# Patient Record
Sex: Male | Born: 1980 | Race: White | Hispanic: No | Marital: Single | Smoking: Current every day smoker
Health system: Southern US, Community
[De-identification: ages and names within clinical notes are randomized; demographics above are authoritative.]

## PROBLEM LIST (undated history)

## (undated) HISTORY — PX: OTHER SURGICAL HISTORY: SHX169

---

## 2001-07-28 ENCOUNTER — Emergency Department (HOSPITAL_COMMUNITY): Admission: EM | Admit: 2001-07-28 | Discharge: 2001-07-28 | Payer: Self-pay | Admitting: Emergency Medicine

## 2001-07-28 ENCOUNTER — Encounter: Payer: Self-pay | Admitting: Emergency Medicine

## 2007-11-15 ENCOUNTER — Emergency Department (HOSPITAL_COMMUNITY): Admission: EM | Admit: 2007-11-15 | Discharge: 2007-11-15 | Payer: Self-pay | Admitting: Emergency Medicine

## 2009-05-13 ENCOUNTER — Ambulatory Visit (HOSPITAL_COMMUNITY): Admission: RE | Admit: 2009-05-13 | Discharge: 2009-05-13 | Payer: Self-pay | Admitting: Family Medicine

## 2010-09-01 ENCOUNTER — Emergency Department (HOSPITAL_COMMUNITY): Payer: No Typology Code available for payment source

## 2010-09-01 ENCOUNTER — Emergency Department (HOSPITAL_COMMUNITY)
Admission: EM | Admit: 2010-09-01 | Discharge: 2010-09-01 | Disposition: A | Discharge status: Home / Self Care | Payer: No Typology Code available for payment source | Attending: Emergency Medicine | Admitting: Emergency Medicine

## 2010-09-01 DIAGNOSIS — M542 Cervicalgia: Secondary | ICD-10-CM | POA: Insufficient documentation

## 2010-09-01 DIAGNOSIS — S139XXA Sprain of joints and ligaments of unspecified parts of neck, initial encounter: Secondary | ICD-10-CM | POA: Insufficient documentation

## 2010-09-01 DIAGNOSIS — M25519 Pain in unspecified shoulder: Secondary | ICD-10-CM | POA: Insufficient documentation

## 2011-03-01 ENCOUNTER — Emergency Department (HOSPITAL_COMMUNITY)
Admission: EM | Admit: 2011-03-01 | Discharge: 2011-03-01 | Disposition: A | Discharge status: Home / Self Care | Payer: Self-pay | Attending: Emergency Medicine | Admitting: Emergency Medicine

## 2011-03-01 ENCOUNTER — Encounter: Payer: Self-pay | Admitting: *Deleted

## 2011-03-01 DIAGNOSIS — L0231 Cutaneous abscess of buttock: Secondary | ICD-10-CM | POA: Insufficient documentation

## 2011-03-01 DIAGNOSIS — L03317 Cellulitis of buttock: Secondary | ICD-10-CM

## 2011-03-01 MED ORDER — DOXYCYCLINE HYCLATE 100 MG PO TABS
100.0000 mg | ORAL_TABLET | Freq: Once | ORAL | Status: AC
  Administered 2011-03-01: 100 mg via ORAL
  Filled 2011-03-01: qty 1

## 2011-03-01 MED ORDER — HYDROCODONE-ACETAMINOPHEN 5-325 MG PO TABS: ORAL_TABLET | ORAL | Status: AC

## 2011-03-01 MED ORDER — DOXYCYCLINE HYCLATE 100 MG PO CAPS: 100.0000 mg | ORAL_CAPSULE | Freq: Two times a day (BID) | ORAL | Status: AC

## 2011-03-01 MED ORDER — IBUPROFEN 800 MG PO TABS
800.0000 mg | ORAL_TABLET | Freq: Once | ORAL | Status: AC
  Administered 2011-03-01: 800 mg via ORAL
  Filled 2011-03-01: qty 1

## 2011-03-01 MED ORDER — HYDROCODONE-ACETAMINOPHEN 5-325 MG PO TABS
1.0000 | ORAL_TABLET | Freq: Once | ORAL | Status: AC
  Administered 2011-03-01: 1 via ORAL
  Filled 2011-03-01: qty 1

## 2011-03-01 NOTE — ED Provider Notes (Signed)
Medical screening examination/treatment/procedure(s) were performed by non-physician practitioner and as supervising physician I was immediately available for consultation/collaboration.   Juliet Rude. Rubin Payor, MD 03/01/11 2018

## 2011-03-01 NOTE — ED Notes (Signed)
Noted abscess to right inner buttock
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
## 2011-03-01 NOTE — ED Notes (Signed)
Abscess to groin area ?

## 2012-05-22 ENCOUNTER — Emergency Department (HOSPITAL_COMMUNITY)
Admission: EM | Admit: 2012-05-22 | Discharge: 2012-05-22 | Disposition: A | Discharge status: Home / Self Care | Payer: Self-pay | Attending: Emergency Medicine | Admitting: Emergency Medicine

## 2012-05-22 ENCOUNTER — Encounter (HOSPITAL_COMMUNITY): Payer: Self-pay | Admitting: *Deleted

## 2012-05-22 DIAGNOSIS — F17200 Nicotine dependence, unspecified, uncomplicated: Secondary | ICD-10-CM | POA: Insufficient documentation

## 2012-05-22 DIAGNOSIS — Z202 Contact with and (suspected) exposure to infections with a predominantly sexual mode of transmission: Secondary | ICD-10-CM | POA: Insufficient documentation

## 2012-05-22 LAB — URINE MICROSCOPIC-ADD ON

## 2012-05-22 LAB — URINALYSIS, ROUTINE W REFLEX MICROSCOPIC
Bilirubin Urine: NEGATIVE
Hgb urine dipstick: NEGATIVE
Specific Gravity, Urine: 1.02 (ref 1.005–1.030)
pH: 6 (ref 5.0–8.0)

## 2012-05-22 MED ORDER — AZITHROMYCIN 250 MG PO TABS
1000.0000 mg | ORAL_TABLET | Freq: Once | ORAL | Status: AC
  Administered 2012-05-22: 1000 mg via ORAL
  Filled 2012-05-22: qty 4

## 2012-05-22 MED ORDER — LIDOCAINE HCL (PF) 1 % IJ SOLN
INTRAMUSCULAR | Status: AC
  Administered 2012-05-22: 18:00:00
  Filled 2012-05-22: qty 5

## 2012-05-22 MED ORDER — CEFTRIAXONE SODIUM 250 MG IJ SOLR
250.0000 mg | Freq: Once | INTRAMUSCULAR | Status: AC
  Administered 2012-05-22: 250 mg via INTRAMUSCULAR
  Filled 2012-05-22: qty 250
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
## 2012-05-24 ENCOUNTER — Telehealth (HOSPITAL_COMMUNITY): Payer: Self-pay | Admitting: Emergency Medicine

## 2012-05-24 LAB — URINE CULTURE

## 2012-05-24 NOTE — ED Provider Notes (Signed)
Medical screening examination/treatment/procedure(s) were performed by non-physician practitioner and as supervising physician I was immediately available for consultation/collaboration.  Jonan Seufert, MD 05/24/12 1014 

## 2012-05-24 NOTE — ED Notes (Signed)
+  Chlamydia. Patient treated with Rocephin and Zithromax. Per protocol MD. [facility] faxed. °
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
## 2012-05-25 ENCOUNTER — Telehealth (HOSPITAL_COMMUNITY): Payer: Self-pay | Admitting: Emergency Medicine

## 2012-05-28 ENCOUNTER — Telehealth (HOSPITAL_COMMUNITY): Payer: Self-pay | Admitting: Emergency Medicine

## 2012-05-28 NOTE — ED Notes (Signed)
Pt called after receiving letter.  ID verified x 2.  Pt informed of dx, tx approp., notify partner(s) for testing and tx and abstain from sex 2 weeks post tx.  Treated with Zithromax and Rocephin.

## 2013-04-08 ENCOUNTER — Other Ambulatory Visit (HOSPITAL_COMMUNITY): Payer: Self-pay | Admitting: Orthopaedic Surgery

## 2013-04-08 DIAGNOSIS — M25511 Pain in right shoulder: Secondary | ICD-10-CM

## 2013-04-24 ENCOUNTER — Ambulatory Visit (HOSPITAL_COMMUNITY)
Admission: RE | Admit: 2013-04-24 | Discharge: 2013-04-24 | Disposition: A | Discharge status: Home / Self Care | Payer: 59 | Source: Ambulatory Visit | Attending: Orthopaedic Surgery | Admitting: Orthopaedic Surgery

## 2013-04-24 ENCOUNTER — Other Ambulatory Visit (HOSPITAL_COMMUNITY): Payer: Self-pay | Admitting: Orthopaedic Surgery

## 2013-04-24 DIAGNOSIS — M719 Bursopathy, unspecified: Secondary | ICD-10-CM | POA: Insufficient documentation

## 2013-04-24 DIAGNOSIS — M25511 Pain in right shoulder: Secondary | ICD-10-CM

## 2013-04-24 DIAGNOSIS — M67919 Unspecified disorder of synovium and tendon, unspecified shoulder: Secondary | ICD-10-CM | POA: Insufficient documentation

## 2013-04-24 DIAGNOSIS — Z1389 Encounter for screening for other disorder: Secondary | ICD-10-CM | POA: Insufficient documentation

## 2013-04-24 DIAGNOSIS — M19019 Primary osteoarthritis, unspecified shoulder: Secondary | ICD-10-CM | POA: Insufficient documentation

## 2015-08-14 ENCOUNTER — Emergency Department (HOSPITAL_COMMUNITY)
Admission: EM | Admit: 2015-08-14 | Discharge: 2015-08-14 | Disposition: A | Discharge status: Home / Self Care | Payer: Self-pay | Attending: Emergency Medicine | Admitting: Emergency Medicine

## 2015-08-14 ENCOUNTER — Encounter (HOSPITAL_COMMUNITY): Payer: Self-pay | Admitting: Emergency Medicine

## 2015-08-14 DIAGNOSIS — K0381 Cracked tooth: Secondary | ICD-10-CM | POA: Insufficient documentation

## 2015-08-14 DIAGNOSIS — K029 Dental caries, unspecified: Secondary | ICD-10-CM | POA: Insufficient documentation

## 2015-08-14 DIAGNOSIS — R51 Headache: Secondary | ICD-10-CM | POA: Insufficient documentation

## 2015-08-14 DIAGNOSIS — F17210 Nicotine dependence, cigarettes, uncomplicated: Secondary | ICD-10-CM | POA: Insufficient documentation

## 2015-08-14 DIAGNOSIS — R6883 Chills (without fever): Secondary | ICD-10-CM | POA: Insufficient documentation

## 2015-08-14 DIAGNOSIS — K0889 Other specified disorders of teeth and supporting structures: Secondary | ICD-10-CM

## 2015-08-14 DIAGNOSIS — R61 Generalized hyperhidrosis: Secondary | ICD-10-CM | POA: Insufficient documentation

## 2015-08-14 MED ORDER — TRAMADOL HCL 50 MG PO TABS: 100.0000 mg | ORAL_TABLET | Freq: Four times a day (QID) | ORAL | Status: AC | PRN

## 2015-08-14 MED ORDER — PENICILLIN V POTASSIUM 500 MG PO TABS
500.0000 mg | ORAL_TABLET | Freq: Four times a day (QID) | ORAL | Status: AC
Start: 2015-08-14 — End: ?

## 2015-08-14 MED ORDER — KETOROLAC TROMETHAMINE 30 MG/ML IJ SOLN
60.0000 mg | Freq: Once | INTRAMUSCULAR | Status: AC
  Administered 2015-08-14: 60 mg via INTRAMUSCULAR
  Filled 2015-08-14: qty 2

## 2015-08-14 MED ORDER — PENICILLIN V POTASSIUM 250 MG PO TABS
500.0000 mg | ORAL_TABLET | Freq: Once | ORAL | Status: AC
  Administered 2015-08-14: 500 mg via ORAL
  Filled 2015-08-14: qty 2

## 2015-08-14 NOTE — ED Notes (Signed)
Pt states understanding of care given and follow up instructions, ambulated from ED
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
## 2015-08-14 NOTE — ED Notes (Signed)
Pt states that he broke a tooth a couple months ago that has been hurting on and off.  Now having numbness from lip over to left jaw

## 2015-08-14 NOTE — Discharge Instructions (Signed)
Use ice on your jaw for comfort. Take ibuprofen 600 mg + acetaminophen 1000 mg 4 times a day for pain. Take the tramadol for pain until gone. Take the Pen VK until gone. YOU NEED TO SEE A DENTIST!!!      Dental Caries Dental caries (also called tooth decay) is the most common oral disease. It can occur at any age but is more common in children and young adults.  HOW DENTAL CARIES DEVELOPS  The process of decay begins when bacteria and foods (particularly sugars and starches) combine in your mouth to produce plaque. Plaque is a substance that sticks to the hard, outer surface of a tooth (enamel). The bacteria in plaque produce acids that attack enamel. These acids may also attack the root surface of a tooth (cementum) if it is exposed. Repeated attacks dissolve these surfaces and create holes in the tooth (cavities). If left untreated, the acids destroy the other layers of the tooth.  RISK FACTORS  Frequent sipping of sugary beverages.   Frequent snacking on sugary and starchy foods, especially those that easily get stuck in the teeth.   Poor oral hygiene.   Dry mouth.   Substance abuse such as methamphetamine abuse.   Broken or poor-fitting dental restorations.   Eating disorders.   Gastroesophageal reflux disease (GERD).   Certain radiation treatments to the head and neck. SYMPTOMS In the early stages of dental caries, symptoms are seldom present. Sometimes white, chalky areas may be seen on the enamel or other tooth layers. In later stages, symptoms may include:  Pits and holes on the enamel.  Toothache after sweet, hot, or cold foods or drinks are consumed.  Pain around the tooth.  Swelling around the tooth. DIAGNOSIS  Most of the time, dental caries is detected during a regular dental checkup. A diagnosis is made after a thorough medical and dental history is taken and the surfaces of your teeth are checked for signs of dental caries. Sometimes special instruments,  such as lasers, are used to check for dental caries. Dental X-ray exams may be taken so that areas not visible to the eye (such as between the contact areas of the teeth) can be checked for cavities.  TREATMENT  If dental caries is in its early stages, it may be reversed with a fluoride treatment or an application of a remineralizing agent at the dental office. Thorough brushing and flossing at home is needed to aid these treatments. If it is in its later stages, treatment depends on the location and extent of tooth destruction:   If a small area of the tooth has been destroyed, the destroyed area will be removed and cavities will be filled with a material such as gold, silver amalgam, or composite resin.   If a large area of the tooth has been destroyed, the destroyed area will be removed and a cap (crown) will be fitted over the remaining tooth structure.   If the center part of the tooth (pulp) is affected, a procedure called a root canal will be needed before a filling or crown can be placed.   If most of the tooth has been destroyed, the tooth may need to be pulled (extracted). HOME CARE INSTRUCTIONS You can prevent, stop, or reverse dental caries at home by practicing good oral hygiene. Good oral hygiene includes:  Thoroughly cleaning your teeth at least twice a day with a toothbrush and dental floss.   Using a fluoride toothpaste. A fluoride mouth rinse may also be  used if recommended by your dentist or health care provider.   Restricting the amount of sugary and starchy foods and sugary liquids you consume.   Avoiding frequent snacking on these foods and sipping of these liquids.   Keeping regular visits with a dentist for checkups and cleanings. PREVENTION   Practice good oral hygiene.  Consider a dental sealant. A dental sealant is a coating material that is applied by your dentist to the pits and grooves of teeth. The sealant prevents food from being trapped in them. It  may protect the teeth for several years.  Ask about fluoride supplements if you live in a community without fluorinated water or with water that has a low fluoride content. Use fluoride supplements as directed by your dentist or health care provider.  Allow fluoride varnish applications to teeth if directed by your dentist or health care provider.   This information is not intended to replace advice given to you by your health care provider. Make sure you discuss any questions you have with your health care provider.   Document Released: 03/24/2002 Document Revised: 07/23/2014 Document Reviewed: 07/04/2012 Elsevier Interactive Patient Education 2016 [employer]  Dental Pain Dental pain may be caused by many things, including:  Tooth decay (cavities or caries). Cavities cause the nerve of your tooth to be open to air and hot or cold temperatures. This can cause pain or discomfort.  Abscess or infection. A dental abscess is an area that is full of infected pus from a bacterial infection in the inner part of the tooth (pulp). It usually happens at the end of the tooth's root.  Injury.  An unknown reason (idiopathic). Your pain may be mild or severe. It may only happen when:  You are chewing.  You are exposed to hot or cold temperature.  You are eating or drinking sugary foods or beverages, such as:  Soda.  Candy. Your pain may also be there all of the time. HOME CARE Watch your dental pain for any changes. Do these things to lessen your discomfort:  Take medicines only as told by your dentist.  If your dentist tells you to take an antibiotic medicine, finish all of it even if you start to feel better.  Keep all follow-up visits as told by your dentist. This is important.  Do not apply heat to the outside of your face.  Rinse your mouth or gargle with salt water if told by your dentist. This helps with pain and swelling.  You can make salt water by adding  tsp of salt to  1 cup of warm water.  Apply ice to the painful area of your face:  Put ice in a plastic bag.  Place a towel between your skin and the bag.  Leave the ice on for 20 minutes, 2-3 times per day.  Avoid foods or drinks that cause you pain, such as:  Very hot or very cold foods or drinks.  Sweet or sugary foods or drinks. GET HELP IF:  Your pain is not helped with medicines.  Your symptoms are worse.  You have new symptoms. GET HELP RIGHT AWAY IF:  You cannot open your mouth.  You are having trouble breathing or swallowing.  You have a fever.  Your face, neck, or jaw is puffy (swollen).   This information is not intended to replace advice given to you by your health care provider. Make sure you discuss any questions you have with your health care provider.  Document Released: 12/19/2007 Document Revised: 11/16/2014 Document Reviewed: 06/28/2014 Elsevier Interactive Patient Education [employer]

## 2017-05-02 ENCOUNTER — Encounter (HOSPITAL_COMMUNITY): Payer: Self-pay

## 2017-05-02 DIAGNOSIS — N3001 Acute cystitis with hematuria: Secondary | ICD-10-CM | POA: Insufficient documentation

## 2017-05-02 DIAGNOSIS — F17210 Nicotine dependence, cigarettes, uncomplicated: Secondary | ICD-10-CM | POA: Insufficient documentation

## 2017-05-02 DIAGNOSIS — Z79899 Other long term (current) drug therapy: Secondary | ICD-10-CM | POA: Insufficient documentation

## 2017-05-02 NOTE — ED Triage Notes (Signed)
Pt reports right flank pain for a couple of days, tonight has passed bright red blood with some burning with urination.

## 2017-05-03 ENCOUNTER — Emergency Department (HOSPITAL_COMMUNITY)
Admission: EM | Admit: 2017-05-03 | Discharge: 2017-05-03 | Disposition: A | Discharge status: Home / Self Care | Payer: Self-pay | Attending: Emergency Medicine | Admitting: Emergency Medicine

## 2017-05-03 DIAGNOSIS — N3001 Acute cystitis with hematuria: Secondary | ICD-10-CM

## 2017-05-03 LAB — URINALYSIS, ROUTINE W REFLEX MICROSCOPIC
Bilirubin Urine: NEGATIVE
GLUCOSE, UA: NEGATIVE mg/dL
Ketones, ur: 20 mg/dL — AB
NITRITE: POSITIVE — AB
PH: 5 (ref 5.0–8.0)
PROTEIN: 100 mg/dL — AB
Specific Gravity, Urine: 1.021 (ref 1.005–1.030)
Squamous Epithelial / LPF: NONE SEEN
TRANS EPITHEL UA: 1

## 2017-05-03 MED ORDER — CEPHALEXIN 500 MG PO CAPS
500.0000 mg | ORAL_CAPSULE | Freq: Once | ORAL | Status: AC
  Administered 2017-05-03: 500 mg via ORAL
  Filled 2017-05-03: qty 1

## 2017-05-03 MED ORDER — HYDROCODONE-ACETAMINOPHEN 5-325 MG PO TABS
1.0000 | ORAL_TABLET | Freq: Once | ORAL | Status: AC
  Administered 2017-05-03: 1 via ORAL
  Filled 2017-05-03: qty 1

## 2017-05-03 MED ORDER — CEPHALEXIN 500 MG PO CAPS: 500.0000 mg | ORAL_CAPSULE | Freq: Four times a day (QID) | ORAL | 0 refills | Status: AC
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
## 2017-05-05 LAB — URINE CULTURE: Culture: >=100000 — AB

## 2017-05-06 ENCOUNTER — Telehealth: Payer: Self-pay | Admitting: Emergency Medicine

## 2017-05-06 NOTE — Telephone Encounter (Signed)
Post ED Visit - Positive Culture Follow-up  Culture report reviewed by antimicrobial stewardship pharmacist:  []  Enzo BiNathan Batchelder, Pharm.D. []  Celedonio MiyamotoJeremy Frens, Pharm.D., BCPS AQ-ID []  Garvin FilaMike Maccia, Pharm.D., BCPS []  Georgina PillionElizabeth Martin, Pharm.D., BCPS []  Pine MountainMinh Pham, 17[address] Rainbow BoulevardPharm.D., BCPS, AAHIVP [x]  Estella HuskMichelle Turner, Pharm.D., BCPS, AAHIVP []  Lysle Pearlachel Rumbarger, PharmD, BCPS []  Casilda Carlsaylor Stone, PharmD, BCPS []  Pollyann SamplesAndy Johnston, PharmD, BCPS  Positive urine culture Treated with cephalexin, organism sensitive to the same and no further patient follow-up is required at this time.  Berle MullMiller, Andrick Rust 05/06/2017, 3:36 PM

## 2017-05-20 ENCOUNTER — Other Ambulatory Visit: Payer: Self-pay

## 2017-05-20 ENCOUNTER — Emergency Department (HOSPITAL_COMMUNITY)
Admission: EM | Admit: 2017-05-20 | Discharge: 2017-05-21 | Disposition: A | Discharge status: Home / Self Care | Payer: Self-pay | Attending: Emergency Medicine | Admitting: Emergency Medicine

## 2017-05-20 ENCOUNTER — Encounter (HOSPITAL_COMMUNITY): Payer: Self-pay | Admitting: Emergency Medicine

## 2017-05-20 DIAGNOSIS — Z79899 Other long term (current) drug therapy: Secondary | ICD-10-CM | POA: Insufficient documentation

## 2017-05-20 DIAGNOSIS — R319 Hematuria, unspecified: Secondary | ICD-10-CM | POA: Insufficient documentation

## 2017-05-20 DIAGNOSIS — F17210 Nicotine dependence, cigarettes, uncomplicated: Secondary | ICD-10-CM | POA: Insufficient documentation

## 2017-05-20 DIAGNOSIS — N390 Urinary tract infection, site not specified: Secondary | ICD-10-CM | POA: Insufficient documentation

## 2017-05-20 NOTE — ED Triage Notes (Signed)
Pt c/o left flank pain and hematuria.

## 2017-05-21 ENCOUNTER — Other Ambulatory Visit: Payer: Self-pay

## 2017-05-21 ENCOUNTER — Emergency Department (HOSPITAL_COMMUNITY): Payer: Self-pay

## 2017-05-21 LAB — URINALYSIS, ROUTINE W REFLEX MICROSCOPIC
BILIRUBIN URINE: NEGATIVE
Glucose, UA: NEGATIVE mg/dL
KETONES UR: NEGATIVE mg/dL
Nitrite: POSITIVE — AB
PROTEIN: 100 mg/dL — AB
Specific Gravity, Urine: 1.015 (ref 1.005–1.030)
pH: 6 (ref 5.0–8.0)

## 2017-05-21 MED ORDER — SULFAMETHOXAZOLE-TRIMETHOPRIM 800-160 MG PO TABS
1.0000 | ORAL_TABLET | Freq: Once | ORAL | Status: AC
  Administered 2017-05-21: 1 via ORAL
  Filled 2017-05-21: qty 1

## 2017-05-21 MED ORDER — KETOROLAC TROMETHAMINE 30 MG/ML IJ SOLN
60.0000 mg | Freq: Once | INTRAMUSCULAR | Status: AC
  Administered 2017-05-21: 60 mg via INTRAMUSCULAR
  Filled 2017-05-21: qty 2

## 2017-05-21 MED ORDER — SULFAMETHOXAZOLE-TRIMETHOPRIM 800-160 MG PO TABS: 1.0000 | ORAL_TABLET | Freq: Two times a day (BID) | ORAL | 0 refills | Status: AC
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
## 2017-05-21 NOTE — Discharge Instructions (Signed)
Drink plenty of fluids. You can take ibuprofen 600 mg and/or acetaminophen 1000 mg every 6 hrs for pain. Take the antibiotic until gone.  Return to the ED if you get fever or vomiting. Otherwise you need to call [facility] to have them evaluate why you are getting these urinary tract infections.

## 2017-05-23 LAB — URINE CULTURE
Culture: >=100000 — AB
SPECIAL REQUESTS: NORMAL

## 2017-05-24 ENCOUNTER — Telehealth: Payer: Self-pay

## 2017-05-24 NOTE — Telephone Encounter (Signed)
Post ED Visit - Positive Culture Follow-up  Culture report reviewed by antimicrobial stewardship pharmacist:  []  Randall Chambers, Pharm.Randall. []  Randall Chambers, Pharm.Randall., BCPS AQ-ID []  Randall Chambers, Pharm.Randall., BCPS []  Randall Chambers, Pharm.Randall., BCPS []  Randall Chambers, [place].Randall., BCPS, AAHIVP []  Randall Chambers, Pharm.Randall., BCPS, AAHIVP []  Randall Chambers, PharmD, BCPS []  Randall Chambers, PharmD, BCPS []  Randall Chambers, PharmD, BCPS Randall Chambers Pharm Randall Positive urine culture Treated with Sulfamethoxazole Trimethoprim, organism sensitive to the same and no further patient follow-up is required at this time.  Randall Chambers, Randall Chambers 05/24/2017, 9:52 AM

## 2019-05-08 IMAGING — CT CT RENAL STONE PROTOCOL
2 of 4 series · 16 of 46 positions shown, 18 images · non-contrast
Comparison: None.

CLINICAL DATA: Left flank pain and hematuria

EXAM:
CT ABDOMEN AND PELVIS WITHOUT CONTRAST
TECHNIQUE: Multidetector CT imaging of the abdomen and pelvis was performed
following the standard protocol without IV contrast.

[Series 2: axial st · axial · 0.74mm/px · z∈[+907,+1342]mm · 13 of 95 slices shown, 15 images]
[im 4/95  soft-tissue]
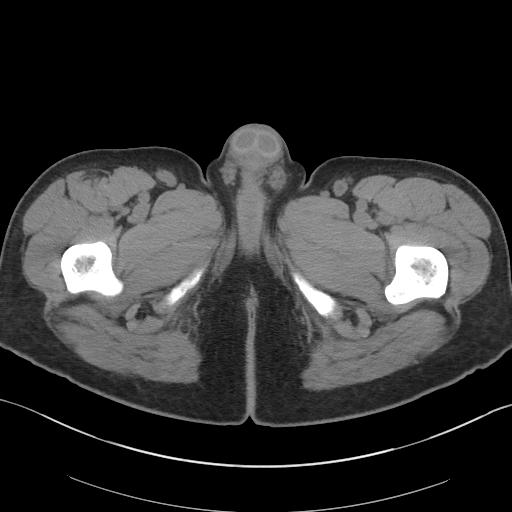
[im 4/95  bone]
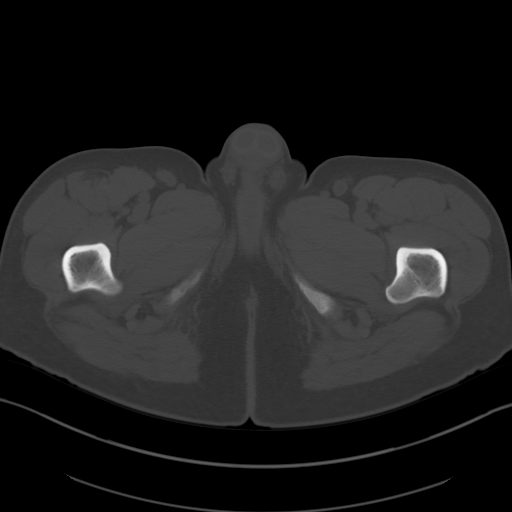
[im 12/95  soft-tissue]
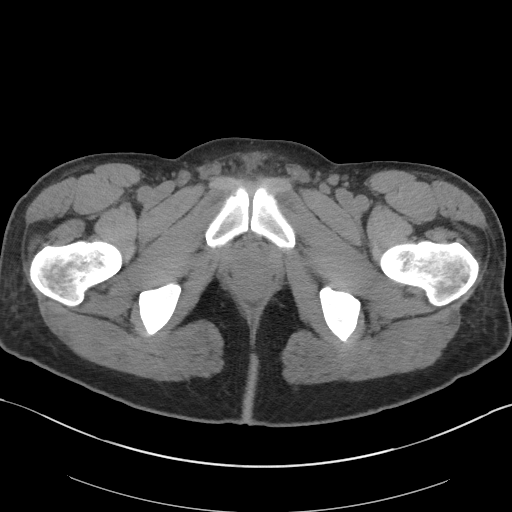
[im 20/95  soft-tissue]
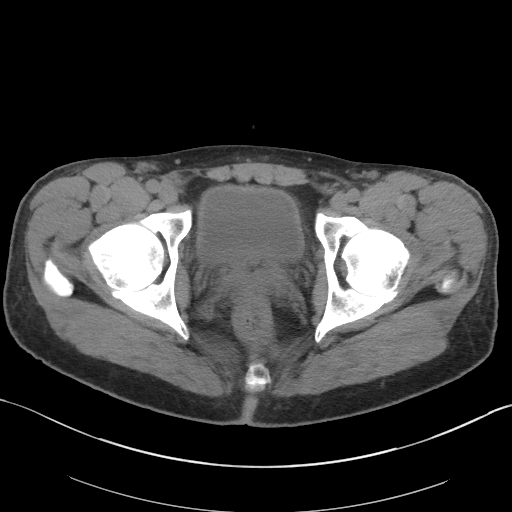
[im 28/95  soft-tissue]
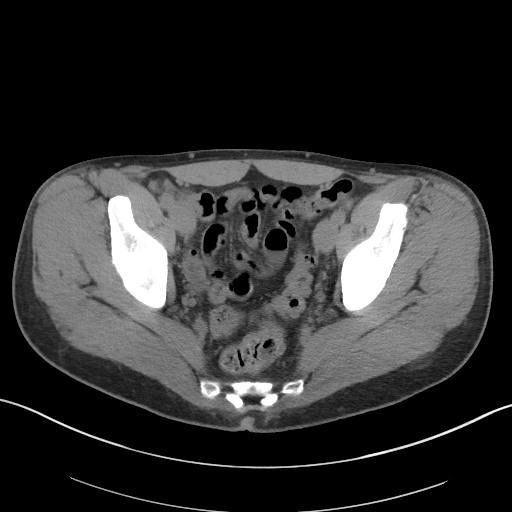
[im 32/95  soft-tissue]
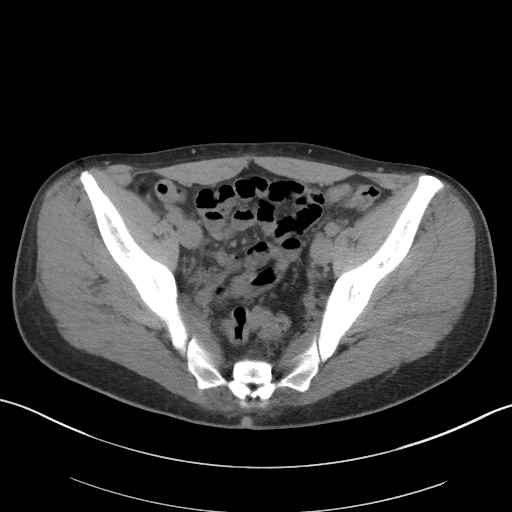
[im 40/95  soft-tissue]
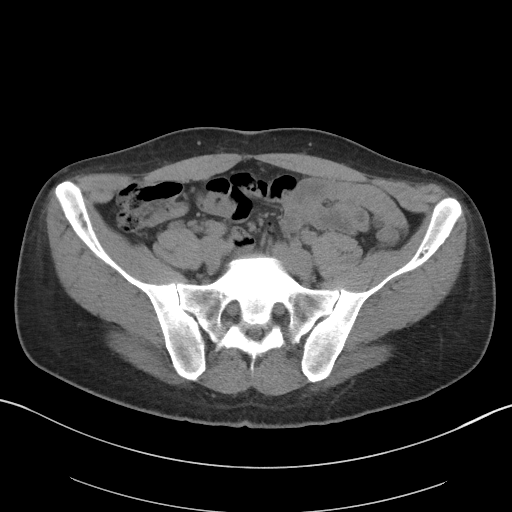
[im 48/95  soft-tissue]
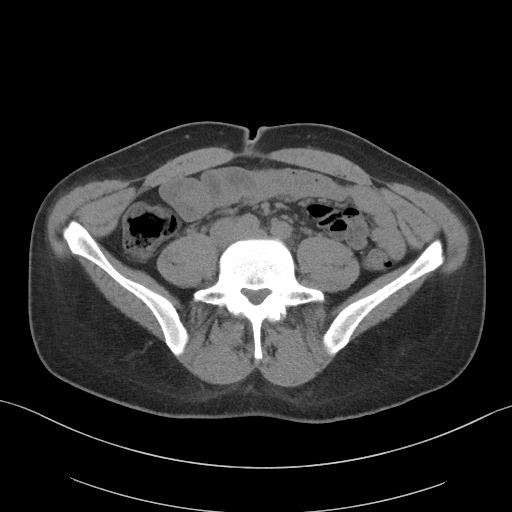
[im 55/95  soft-tissue]
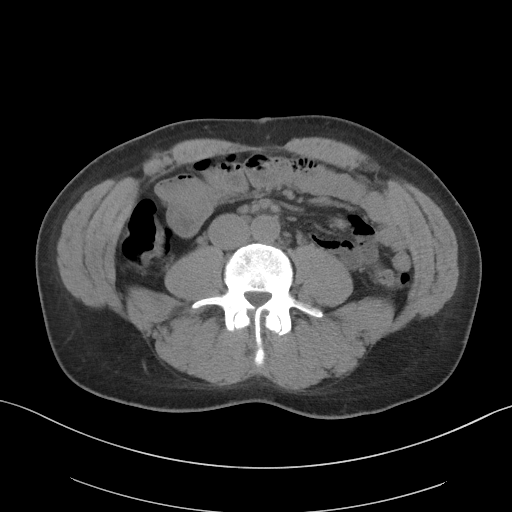
[im 63/95  soft-tissue]
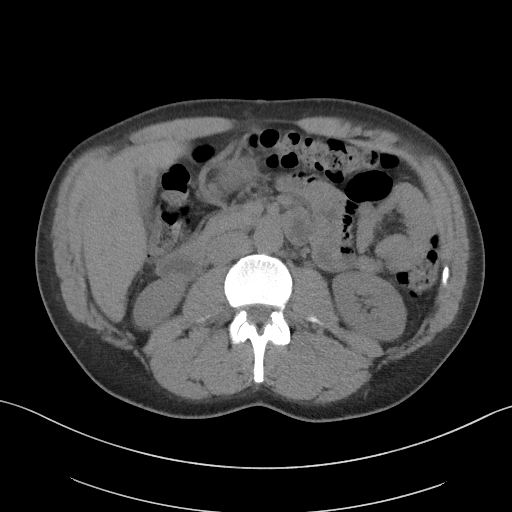
[im 63/95  bone]
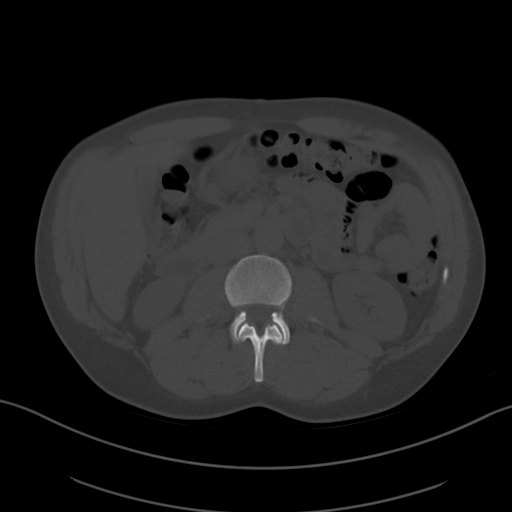
[im 67/95  soft-tissue]
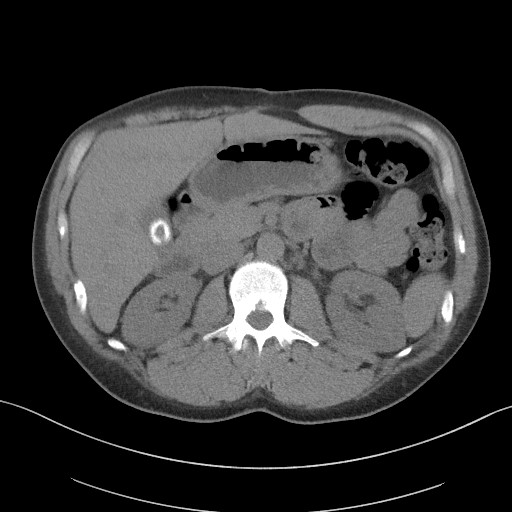
[im 75/95  soft-tissue]
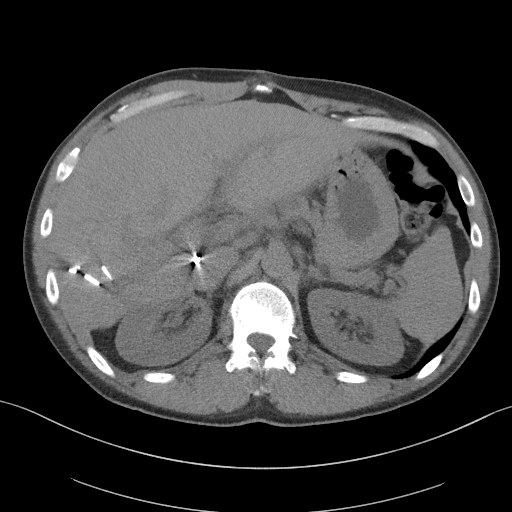
[im 83/95  soft-tissue]
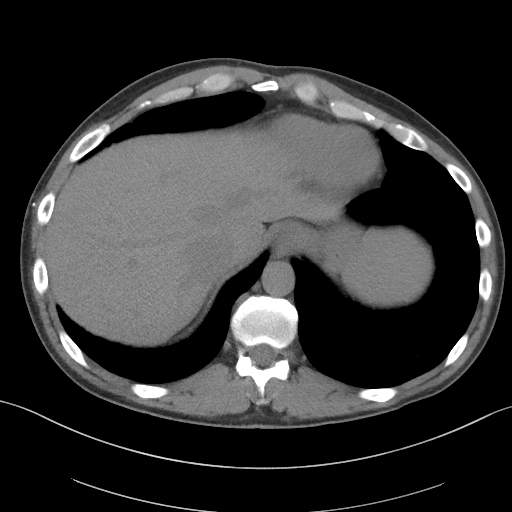
[im 91/95  soft-tissue]
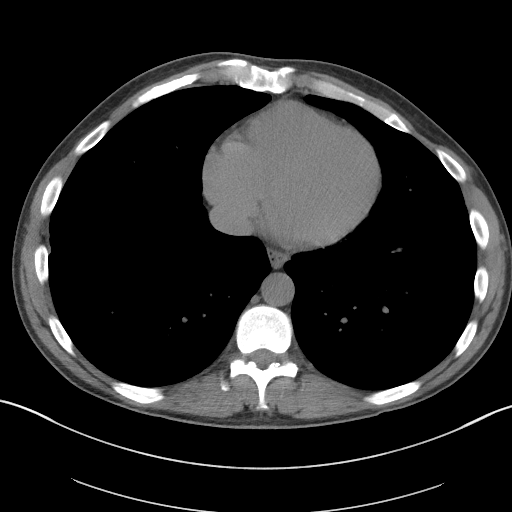

[Series 5: coronal st · coronal · 0.79mm/px · 3 of 93 slices shown]
[im 31/93  soft-tissue]
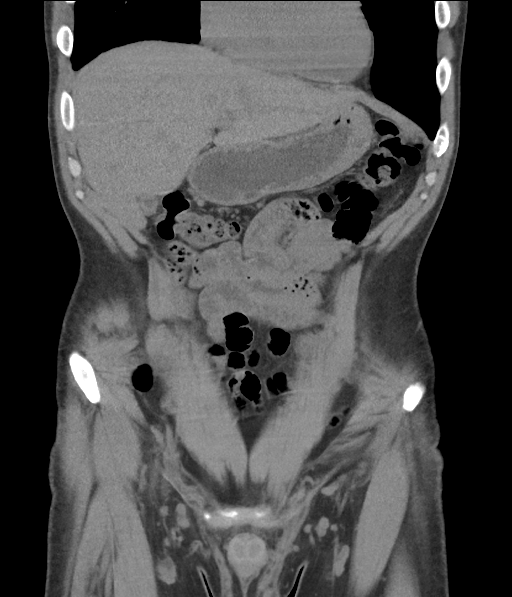
[im 41/93  soft-tissue]
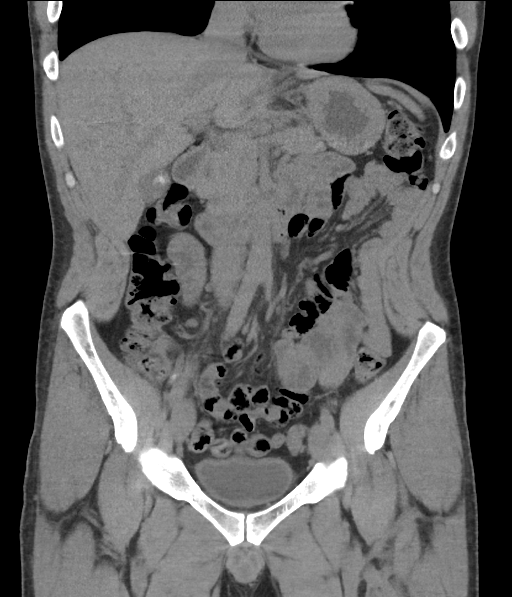
[im 52/93  soft-tissue]
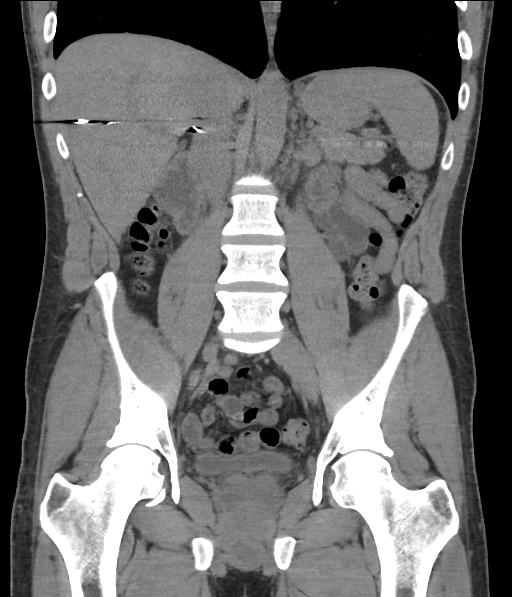

[16 of 46 positions shown; findings below may reference images not displayed]

FINDINGS: Lower chest: Focal pleural and parenchymal scarring in the right
lower lobe. No acute consolidation or pleural effusion. Normal heart
size.

Hepatobiliary: Surgical clips in the right hepatic lobe. Calcified
gallstone. No biliary dilatation

Pancreas: Unremarkable. No pancreatic ductal dilatation or
surrounding inflammatory changes.

Spleen: Normal in size without focal abnormality.

Adrenals/Urinary Tract: Metallic fragment posterior to the left
kidney.

Stomach/Bowel: Stomach is within normal limits. Appendix appears
normal. Small amount of high density within the lumen of the
appendix but no inflammation. No evidence of bowel wall thickening,
distention, or inflammatory changes.

Vascular/Lymphatic: No significant vascular findings are present. No
enlarged abdominal or pelvic lymph nodes.

Reproductive: Prostate is unremarkable.

Other: Negative for free air or free fluid.

Musculoskeletal: No acute or suspicious bone lesion. Probable
chronic superior endplate deformity at L1
IMPRESSION: 1. Negative for nephrolithiasis, hydronephrosis, or ureteral stone
2. Gallstone

## 2021-06-20 ENCOUNTER — Emergency Department (HOSPITAL_COMMUNITY): Payer: Self-pay

## 2021-06-20 ENCOUNTER — Other Ambulatory Visit: Payer: Self-pay

## 2021-06-20 ENCOUNTER — Emergency Department (HOSPITAL_COMMUNITY)
Admission: EM | Admit: 2021-06-20 | Discharge: 2021-06-20 | Disposition: A | Discharge status: Home / Self Care | Payer: Self-pay | Attending: Emergency Medicine | Admitting: Emergency Medicine

## 2021-06-20 DIAGNOSIS — M25511 Pain in right shoulder: Secondary | ICD-10-CM | POA: Insufficient documentation

## 2021-06-20 DIAGNOSIS — F17210 Nicotine dependence, cigarettes, uncomplicated: Secondary | ICD-10-CM | POA: Insufficient documentation

## 2021-06-20 MED ORDER — IBUPROFEN 800 MG PO TABS: 800.0000 mg | ORAL_TABLET | Freq: Once | ORAL | Status: DC

## 2021-06-20 MED ORDER — METHOCARBAMOL 500 MG PO TABS: 500.0000 mg | ORAL_TABLET | Freq: Once | ORAL | Status: DC
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
